# Patient Record
Sex: Female | Born: 2000 | Race: Black or African American | Hispanic: No | Marital: Single | Smoking: Never smoker
Health system: Southern US, Community
[De-identification: ages and names within clinical notes are randomized; demographics above are authoritative.]

## PROBLEM LIST (undated history)

## (undated) DIAGNOSIS — J45909 Unspecified asthma, uncomplicated: Secondary | ICD-10-CM

## (undated) DIAGNOSIS — F419 Anxiety disorder, unspecified: Secondary | ICD-10-CM

---
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
## 2020-11-26 ENCOUNTER — Emergency Department (HOSPITAL_COMMUNITY)
Admission: EM | Admit: 2020-11-26 | Discharge: 2020-11-26 | Disposition: A | Discharge status: Home / Self Care | Payer: Federal, State, Local not specified - PPO | Attending: Emergency Medicine | Admitting: Emergency Medicine

## 2020-11-26 ENCOUNTER — Encounter (HOSPITAL_COMMUNITY): Payer: Self-pay

## 2020-11-26 DIAGNOSIS — J020 Streptococcal pharyngitis: Secondary | ICD-10-CM | POA: Insufficient documentation

## 2020-11-26 DIAGNOSIS — J45909 Unspecified asthma, uncomplicated: Secondary | ICD-10-CM | POA: Insufficient documentation

## 2020-11-26 DIAGNOSIS — Z20822 Contact with and (suspected) exposure to covid-19: Secondary | ICD-10-CM | POA: Diagnosis not present

## 2020-11-26 DIAGNOSIS — J029 Acute pharyngitis, unspecified: Secondary | ICD-10-CM | POA: Diagnosis present

## 2020-11-26 LAB — RESP PANEL BY RT-PCR (FLU A&B, COVID) ARPGX2
Influenza A by PCR: NEGATIVE
Influenza B by PCR: NEGATIVE
SARS Coronavirus 2 by RT PCR: NEGATIVE

## 2020-11-26 LAB — GROUP A STREP BY PCR: Group A Strep by PCR: DETECTED — AB

## 2020-11-26 MED ORDER — DEXAMETHASONE 10 MG/ML FOR PEDIATRIC ORAL USE
10.0000 mg | Freq: Once | INTRAMUSCULAR | Status: AC
  Administered 2020-11-26: 10 mg via ORAL
  Filled 2020-11-26: qty 1

## 2020-11-26 MED ORDER — LIDOCAINE VISCOUS HCL 2 % MT SOLN
15.0000 mL | Freq: Once | OROMUCOSAL | Status: AC
  Administered 2020-11-26: 15 mL via OROMUCOSAL
  Filled 2020-11-26: qty 15

## 2020-11-26 MED ORDER — PENICILLIN G BENZATHINE 1200000 UNIT/2ML IM SUSY
1.2000 10*6.[IU] | PREFILLED_SYRINGE | Freq: Once | INTRAMUSCULAR | Status: AC
  Administered 2020-11-26: 1.2 10*6.[IU] via INTRAMUSCULAR
  Filled 2020-11-26: qty 2

## 2020-11-26 MED ORDER — DEXAMETHASONE 1 MG/ML PO CONC: 10.0000 mg | Freq: Once | ORAL | Status: DC

## 2020-11-26 MED ORDER — LIDOCAINE VISCOUS HCL 2 % MT SOLN: 15.0000 mL | OROMUCOSAL | 0 refills | Status: AC | PRN

## 2020-11-26 NOTE — ED Triage Notes (Signed)
Pt c/o sore throat. No known sick contacts.

## 2020-11-26 NOTE — ED Provider Notes (Signed)
Emergency Medicine Provider Triage Evaluation Note  Heidi Morris , a 20 y.o. female  was evaluated in triage.  Pt complains of sore throat x3 days.  Hurts to swallow.  Feels like she has some swelling in her throat.  No known COVID exposures.  No cough.  Has been able to tolerate her secretions.  No neck stiffness or neck rigidity.  No congestion, rhinorrhea  Review of Systems  Positive: Sore throat Negative: Cough, chest pain, congestion, rhinorrhea  Physical Exam  BP 114/73 (BP Location: Right Arm)   Pulse 82   Temp 99.3 F (37.4 C) (Oral)   Resp 16   SpO2 93%  Gen:   Awake, no distress   Mouth:  Posterior oropharyngeal erythema however no exudates.  Tonsils 0 bilaterally.  No evidence of PTA or RPA Neck:  Full range of motion without difficulty Resp:  Normal effort  MSK:   Moves extremities without difficulty  Other:    Medical Decision Making  Medically screening exam initiated at 1:15 PM.  Appropriate orders placed.  Heidi Morris was informed that the remainder of the evaluation will be completed by another provider, this initial triage assessment does not replace that evaluation, and the importance of remaining in the ED until their evaluation is complete.  Sore throat   Lorenda Grecco A, PA-C 11/26/20 1316    Ernie Avena, MD 11/27/20 [phone]

## 2020-11-26 NOTE — ED Provider Notes (Signed)
[facility]-EMERGENCY DEPT Provider Note   CSN: [id number] Arrival date & time: 11/26/20  1157     History Chief Complaint  Patient presents with   Sore Throat    Heidi Morris is a 20 y.o. female with past medical history significant for asthma who presents for vaginal sore throat x3 days.  Hurts to swallow.  Feels like she has some swelling in her throat.  No known COVID exposures.  No cough.  Has been able to tolerate her secretions.  Feels like "knives" when she swallows solid food.  No fever, chills, congestion, rhinorrhea, neck stiffness, neck rigidity, chest pain, shortness of breath.  Denies additional aggravating or alleviating factors.  Rates her pain a 7/10.  History obtained from patient and past medical records.  No interpreter used.  HPI     Past Medical History:  Diagnosis Date   Asthma     Patient Active Problem List   Diagnosis Date Noted   Asthma    Closed fracture of shaft of left femur, initial encounter ([facility]) 07/22/2019    Past Surgical History:  Procedure Laterality Date   FEMUR IM NAIL Left 07/23/2019   Procedure: INTRAMEDULLARY (IM) RETROGRADE FEMORAL NAILING;  Surgeon: Roby Lofts, MD;  Location: [facility];  Service: Orthopedics;  Laterality: Left;     OB History   No obstetric history on file.     Family History  Problem Relation Age of Onset   Pulmonary embolism Mother    Pulmonary embolism Maternal Grandmother     Social History   Tobacco Use   Smoking status: Never   Smokeless tobacco: Never    Home Medications Prior to Admission medications   Medication Sig Start Date End Date Taking? Authorizing Provider  lidocaine (XYLOCAINE) 2 % solution Use as directed 15 mLs in the mouth or throat as needed for up to 4 days for mouth pain. 11/26/20 11/30/20 Yes Landri Dorsainvil A, PA-C  albuterol (VENTOLIN HFA) 108 (90 Base) MCG/ACT inhaler Inhale 2 puffs into the lungs every 6 (six) hours as needed for wheezing or  shortness of breath.    [provider]  Cholecalciferol (VITAMIN D) 125 MCG (5000 UT) CAPS Take 5,000 Units by mouth daily. 07/25/19   Despina Hidden, PA-C  enoxaparin (LOVENOX) 40 MG/0.4ML injection Inject 0.4 mLs (40 mg total) into the skin daily. 07/25/19 08/24/19  Despina Hidden, PA-C  HYDROcodone-acetaminophen (NORCO) 7.5-325 MG tablet Take 1-2 tablets by mouth every 6 (six) hours as needed for severe pain. 07/25/19   Despina Hidden, PA-C  methocarbamol (ROBAXIN) 500 MG tablet Take 1 tablet (500 mg total) by mouth every 6 (six) hours as needed for muscle spasms. 07/25/19   Despina Hidden, PA-C  Burr Medico 150-35 MCG/24HR transdermal patch Place 1 patch onto the skin once a week. Wednesdays 07/14/19   [provider]    Allergies    Other  Review of Systems   Review of Systems  Constitutional: Negative.   HENT:  Positive for sore throat and trouble swallowing (Pain with swallowing). Negative for congestion, dental problem, ear discharge, ear pain, hearing loss, mouth sores, postnasal drip, rhinorrhea, sinus pressure, sinus pain, sneezing and voice change.   Respiratory: Negative.    Cardiovascular: Negative.   Gastrointestinal: Negative.   Genitourinary: Negative.   Musculoskeletal: Negative.   Neurological: Negative.   All other systems reviewed and are negative.  Physical Exam Updated Vital Signs BP 114/73 (BP Location: Right Arm)   Pulse  82   Temp 99.3 F (37.4 C) (Oral)   Resp 16   SpO2 93%   Physical Exam Vitals and nursing note reviewed.  Constitutional:      General: She is not in acute distress.    Appearance: She is well-developed. She is not ill-appearing, toxic-appearing or diaphoretic.  HENT:     Head: Normocephalic and atraumatic.     Jaw: There is normal jaw occlusion.     Right Ear: Tympanic membrane and ear canal normal.     Left Ear: Tympanic membrane and ear canal normal.     Nose: No congestion or rhinorrhea.     Mouth/Throat:     Lips:  Pink.     Mouth: Mucous membranes are moist.     Palate: No mass and lesions.     Pharynx: Uvula midline. Posterior oropharyngeal erythema and uvula swelling present.     Tonsils: No tonsillar abscesses. 0 on the right. 0 on the left.     Comments: History oropharyngeal erythema.  Tonsils 0 bilaterally.  No exudate.  Uvula midline.  Very minimal uvula swelling.  No evidence of PTA or RPA.  Sublingual area soft.  No pooling of secretions. Eyes:     Pupils: Pupils are equal, round, and reactive to light.  Neck:     Trachea: Trachea and phonation normal.     Comments: No neck stiffness or neck rigidity Cardiovascular:     Rate and Rhythm: Normal rate.     Pulses: Normal pulses.          Radial pulses are 2+ on the right side and 2+ on the left side.     Heart sounds: Normal heart sounds.  Pulmonary:     Effort: Pulmonary effort is normal. No respiratory distress.     Breath sounds: Normal breath sounds and air entry.  Chest:     Comments: Equal rise and fall to chest wall Abdominal:     General: There is no distension.  Musculoskeletal:        General: Normal range of motion.     Cervical back: Full passive range of motion without pain and normal range of motion.  Skin:    General: Skin is warm and dry.  Neurological:     General: No focal deficit present.     Mental Status: She is alert.  Psychiatric:        Mood and Affect: Mood normal.    ED Results / Procedures / Treatments   Labs (all labs ordered are listed, but only abnormal results are displayed) Labs Reviewed  GROUP A STREP BY PCR - Abnormal; Notable for the following components:      Result Value   Group A Strep by PCR DETECTED (*)    All other components within normal limits  RESP PANEL BY RT-PCR (FLU A&B, COVID) ARPGX2    EKG None  Radiology No results found.  Procedures Procedures   Medications Ordered in ED Medications  penicillin g benzathine (BICILLIN LA) 1200000 UNIT/2ML injection 1.2 Million  Units (1.2 Million Units Intramuscular Given 11/26/20 1531)  lidocaine (XYLOCAINE) 2 % viscous mouth solution 15 mL (15 mLs Mouth/Throat Given 11/26/20 1530)  dexamethasone (DECADRON) 10 MG/ML injection for Pediatric ORAL use 10 mg (10 mg Oral Given 11/26/20 1530)    ED Course  I have reviewed the triage vital signs and the nursing notes.  Pertinent labs & imaging results that were available during my care of the patient were reviewed by me  and considered in my medical decision making (see chart for details).  Here for evaluation of sore throat.  She is afebrile, nonseptic, not ill-appearing.  No recent COVID exposures.  Does have some posterior oropharyngeal erythema however no exudates.  Tonsils 0 bilaterally.  No evidence of PTA or RPA.  No pooling of secretions.  She has no neck stiffness neck rigidity.  She has no meningismus.  No phonation changes.  Heart and lungs clear.  Plan a COVID, strep reassess  COVID-negative  Strep positive.  She is tolerating p.o. intakes.  She elects for IM Bicillin.  We will treat symptomatically.  We will have her follow-up outpatient return for new or worsening symptoms.  The patient has been appropriately medically screened and/or stabilized in the ED. I have low suspicion for any other emergent medical condition which would require further screening, evaluation or treatment in the ED or require inpatient management.  Patient is hemodynamically stable and in no acute distress.  Patient able to ambulate in department prior to ED.  Evaluation does not show acute pathology that would require ongoing or additional emergent interventions while in the emergency department or further inpatient treatment.  I have discussed the diagnosis with the patient and answered all questions.  Pain is been managed while in the emergency department and patient has no further complaints prior to discharge.  Patient is comfortable with plan discussed in room and is stable for discharge  at this time.  I have discussed strict return precautions for returning to the emergency department.  Patient was encouraged to follow-up with PCP/specialist refer to at discharge.     MDM Rules/Calculators/A&P                           MADALINA ROSMAN was evaluated in Emergency Department on 11/26/2020 for the symptoms described in the history of present illness. She was evaluated in the context of the global COVID-19 pandemic, which necessitated consideration that the patient might be at risk for infection with the SARS-CoV-2 virus that causes COVID-19. Institutional protocols and algorithms that pertain to the evaluation of patients at risk for COVID-19 are in a state of rapid change based on information released by regulatory bodies including the CDC and federal and state organizations. These policies and algorithms were followed during the patient's care in the ED.  Final Clinical Impression(s) / ED Diagnoses Final diagnoses:  Strep pharyngitis    Rx / DC Orders ED Discharge Orders          Ordered    lidocaine (XYLOCAINE) 2 % solution  As needed        11/26/20 1539             Cloyce Blankenhorn A, PA-C 11/26/20 1540    Ernie Avena, MD 11/27/20 1444

## 2020-11-26 NOTE — ED Notes (Signed)
Patient temperature at discharge was 102.1. Instead of having the temperature addressed her, she wished to go home and take tylenol. PA made aware.

## 2020-11-26 NOTE — Discharge Instructions (Addendum)
You may take Tylenol and ibuprofen as needed at home for the pain.  Make sure to drink plenty of fluids.  I have written you for a liquid numbing medicine to help with the pain

## 2021-11-12 IMAGING — DX DG KNEE 1-2V PORT*L*
3 series · 3 of 3 positions shown · non-contrast
Comparison: Portable exam 3383 hours compared to 07/22/2019

CLINICAL DATA: Femoral fracture, in traction, post fall

EXAM:
PORTABLE LEFT KNEE - 1-2 VIEW

[knee ap]
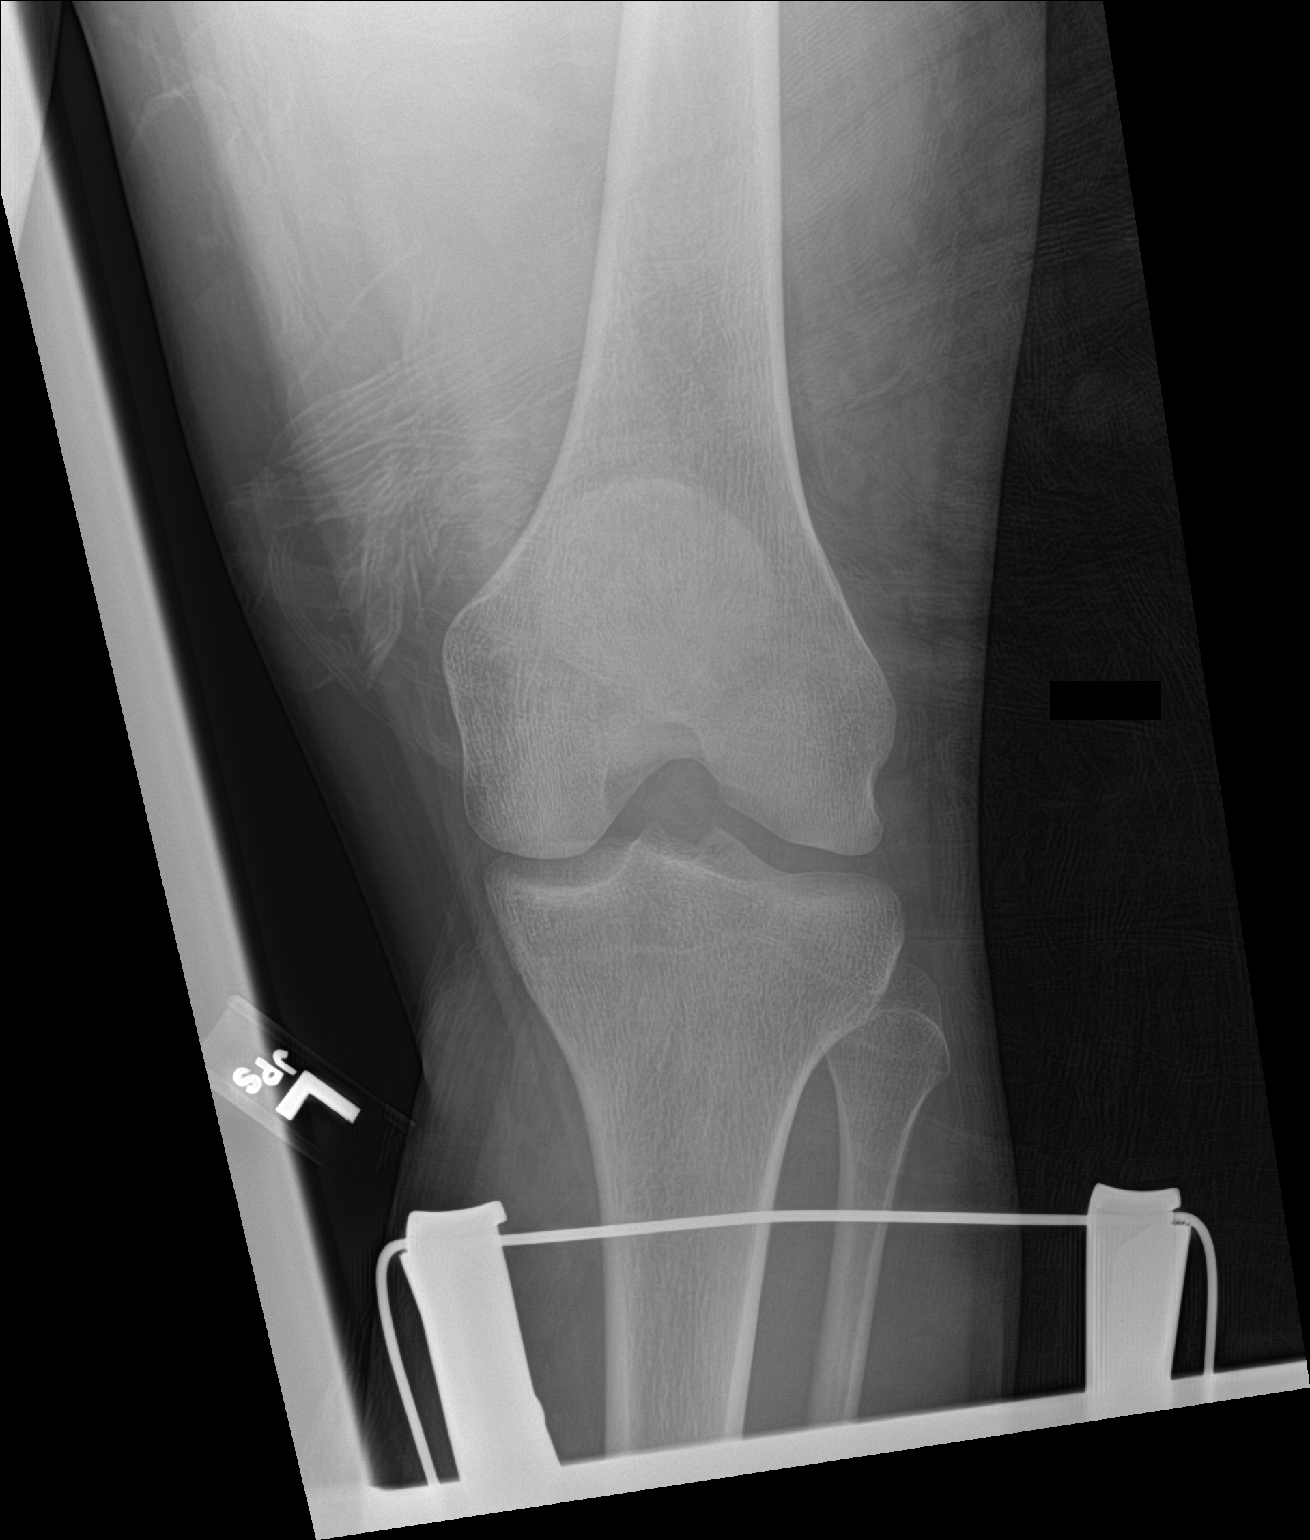

[knee lat (1 of 2)]
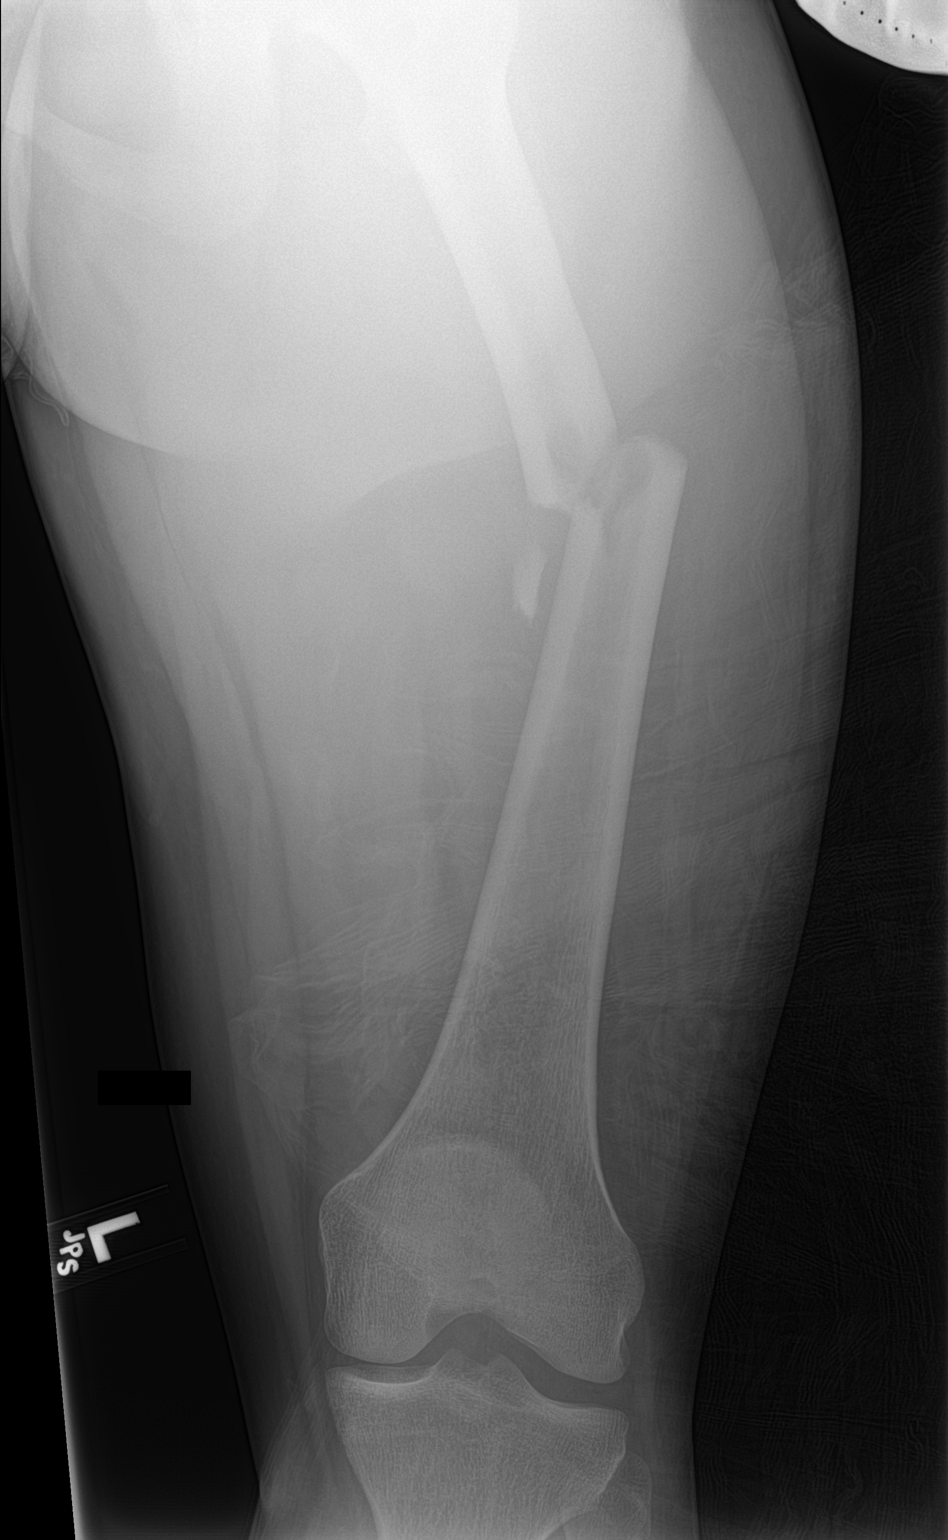

[knee lat (2 of 2)]
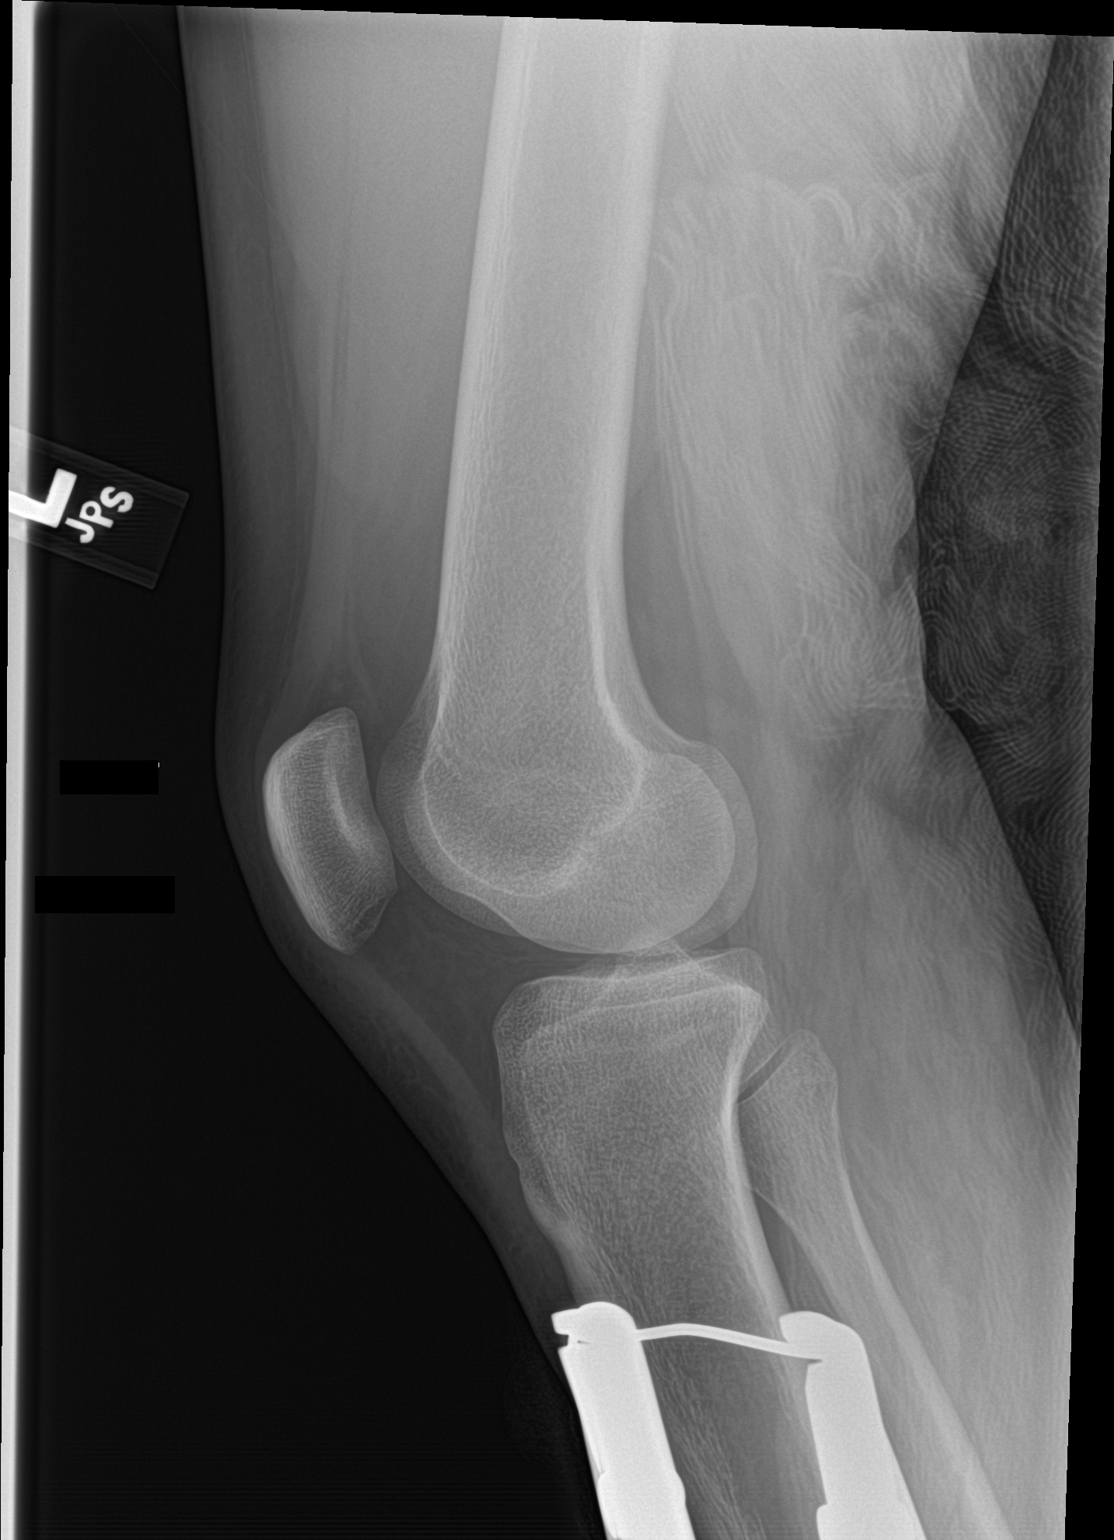

[3 of 3 positions shown; findings below may reference images not displayed]

FINDINGS: Osseous mineralization normal.

Joint spaces preserved.

Fracture mid LEFT femoral diaphysis with displacement and apex
lateral angulation again seen.

No additional fracture, dislocation, or bone destruction.
IMPRESSION: Angulated and displaced mid LEFT femoral diaphyseal fracture.

No additional LEFT knee abnormalities.

## 2021-11-12 IMAGING — RF DG FEMUR 2+V*L*
1 series · 9 of 9 positions shown · IV contrast (agent unspecified)
Comparison: Same day.

CLINICAL DATA: Intramedullary rod fixation.

EXAM:
DG C-ARM 1-60 MIN; LEFT FEMUR 2 VIEWS
CONTRAST:  None.
FLUOROSCOPY TIME:  Fluoroscopy Time:  2 minutes 17 seconds.
Number of Acquired Spot Images: 9.

[Series 1: run · 9 of 9 slices shown]
[im 1/9]
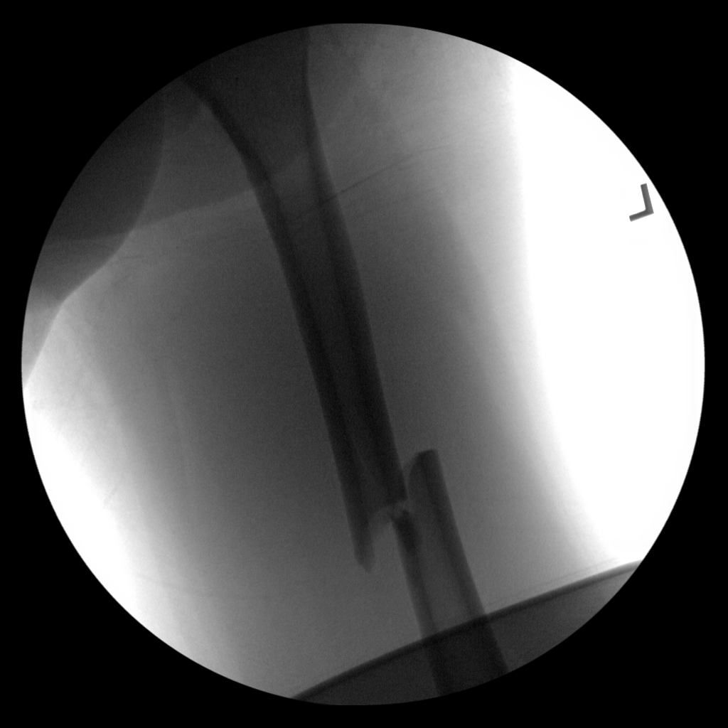
[im 2/9]
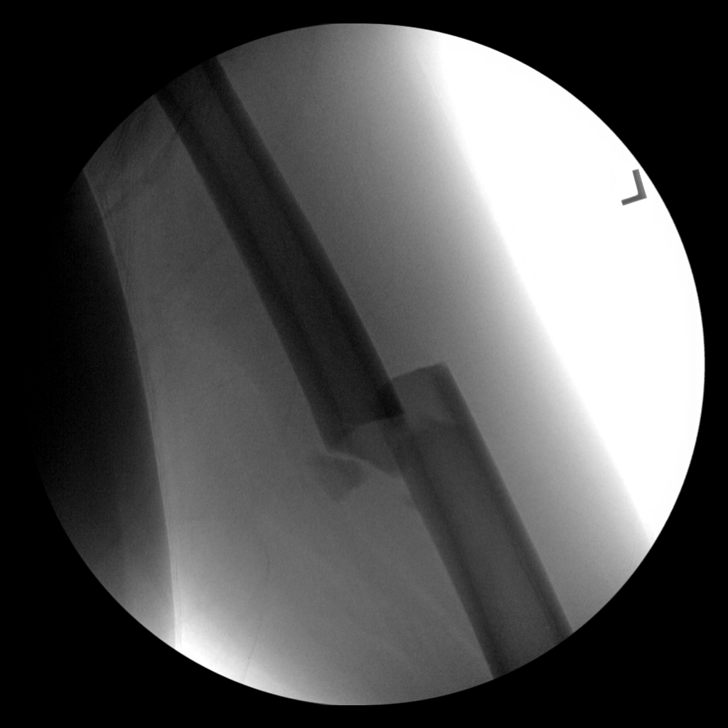
[im 3/9]
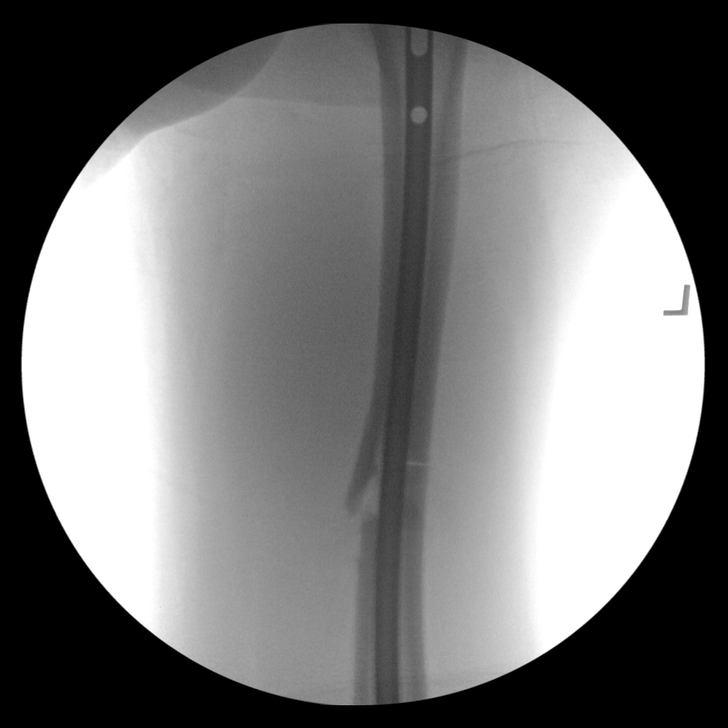
[im 4/9]
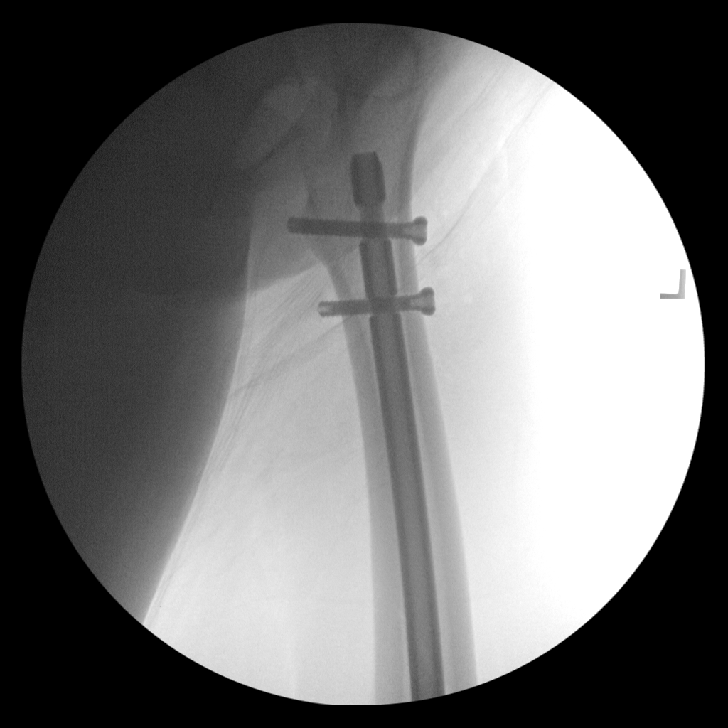
[im 5/9]
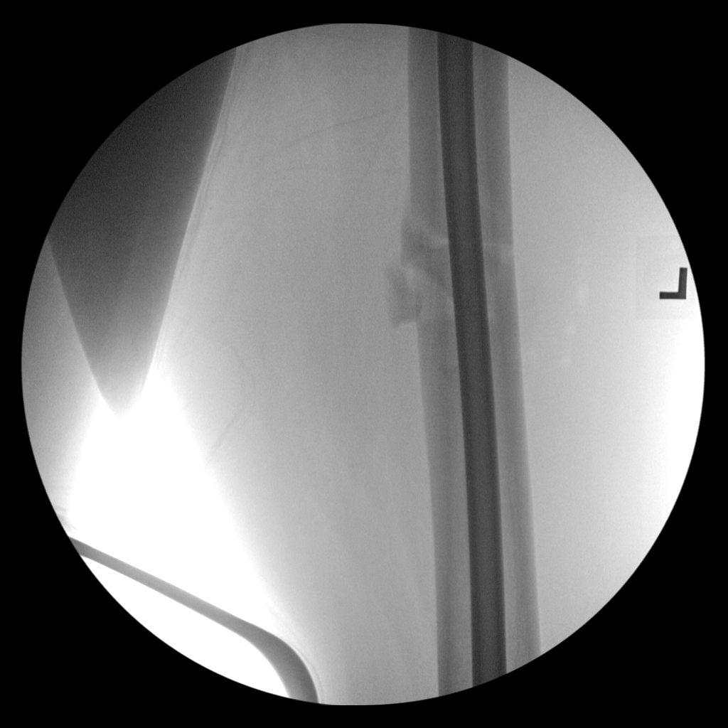
[im 6/9]
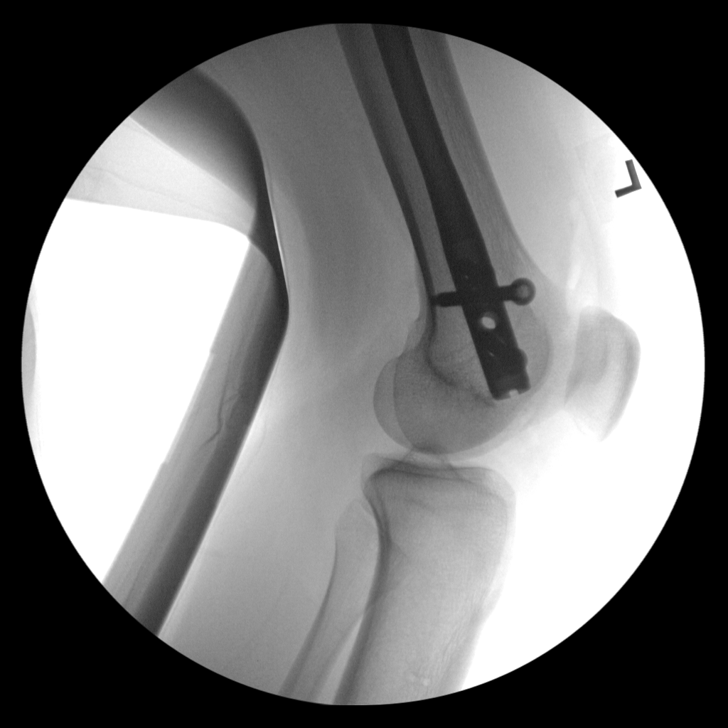
[im 7/9]
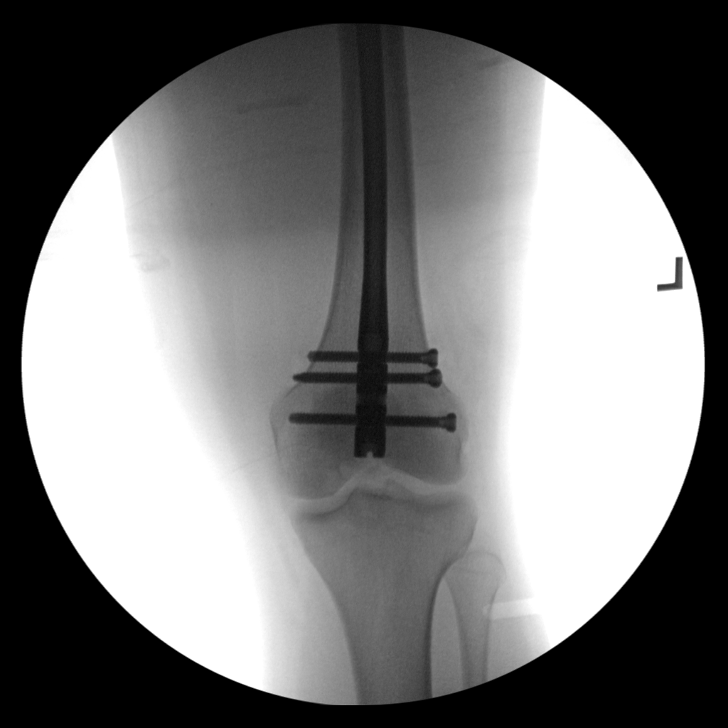
[im 8/9]
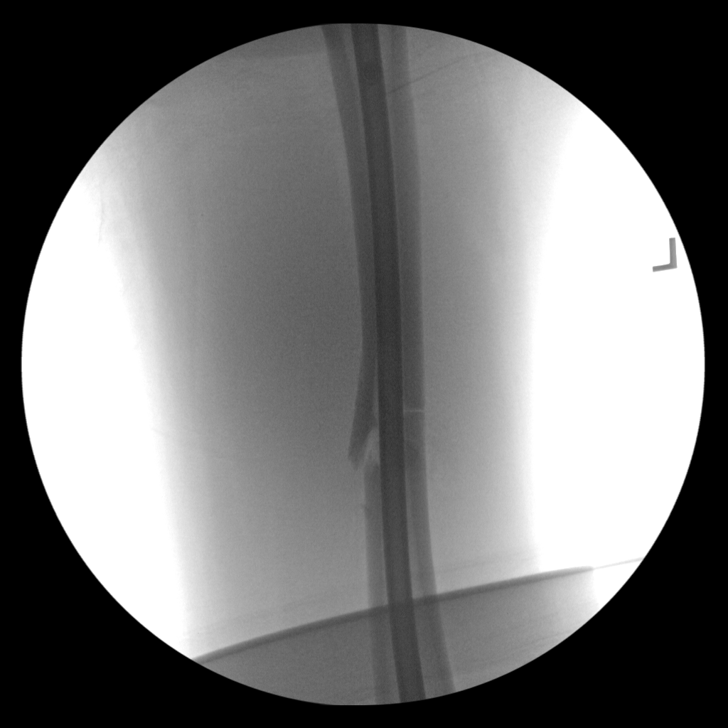
[im 9/9]
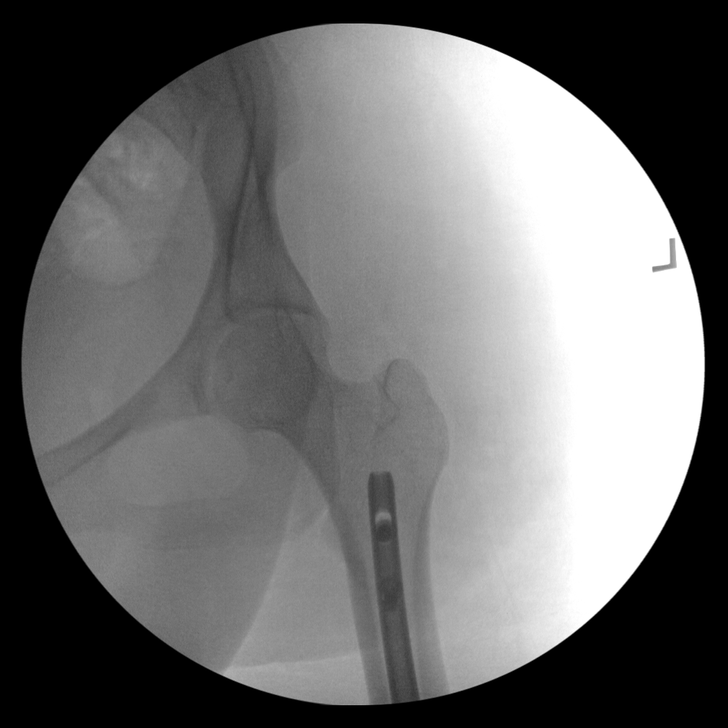

[9 of 9 positions shown; findings below may reference images not displayed]

FINDINGS: Nine intraoperative fluoroscopic images were obtained of the left
femur. These images demonstrate intramedullary rod fixation of left
femoral shaft fracture. Good alignment of fracture components is
noted.
IMPRESSION: Status post intramedullary rod fixation of left femoral shaft
fracture.

## 2022-08-04 ENCOUNTER — Emergency Department (HOSPITAL_COMMUNITY): Payer: Federal, State, Local not specified - PPO

## 2022-08-04 ENCOUNTER — Emergency Department (HOSPITAL_COMMUNITY)
Admission: EM | Admit: 2022-08-04 | Discharge: 2022-08-05 | Disposition: A | Discharge status: Home / Self Care | Payer: Federal, State, Local not specified - PPO | Attending: Emergency Medicine | Admitting: Emergency Medicine

## 2022-08-04 ENCOUNTER — Encounter (HOSPITAL_COMMUNITY): Payer: Self-pay

## 2022-08-04 DIAGNOSIS — R1031 Right lower quadrant pain: Secondary | ICD-10-CM | POA: Diagnosis not present

## 2022-08-04 DIAGNOSIS — J45909 Unspecified asthma, uncomplicated: Secondary | ICD-10-CM | POA: Insufficient documentation

## 2022-08-04 DIAGNOSIS — M542 Cervicalgia: Secondary | ICD-10-CM | POA: Diagnosis not present

## 2022-08-04 DIAGNOSIS — Y92410 Unspecified street and highway as the place of occurrence of the external cause: Secondary | ICD-10-CM | POA: Insufficient documentation

## 2022-08-04 DIAGNOSIS — S0990XA Unspecified injury of head, initial encounter: Secondary | ICD-10-CM | POA: Diagnosis present

## 2022-08-04 DIAGNOSIS — S0083XA Contusion of other part of head, initial encounter: Secondary | ICD-10-CM | POA: Insufficient documentation

## 2022-08-04 DIAGNOSIS — M25551 Pain in right hip: Secondary | ICD-10-CM | POA: Diagnosis not present

## 2022-08-04 DIAGNOSIS — M79604 Pain in right leg: Secondary | ICD-10-CM | POA: Diagnosis not present

## 2022-08-04 HISTORY — DX: Anxiety disorder, unspecified: F41.9

## 2022-08-04 LAB — PREGNANCY, URINE: Preg Test, Ur: NEGATIVE
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
## 2022-08-05 MED ORDER — IBUPROFEN 800 MG PO TABS
800.0000 mg | ORAL_TABLET | Freq: Once | ORAL | Status: AC
  Administered 2022-08-05: 800 mg via ORAL
  Filled 2022-08-05: qty 1
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
## 2022-08-05 NOTE — Discharge Instructions (Addendum)
Take NSAIDs or Tylenol as needed for the next week. Take this medicine with food. Watch for signs of concussion including light sensitivity, headache, brain fog.  Avoid screens for the next several days.  Take Tylenol or ibuprofen if needed.  Follow-up with your primary care doctor. Use a heating pad for sore muscles - use for 20 minutes several times a day Try gentle range of motion exercises Return for worsening symptoms including severe vomiting, vision changes, confusion, worsening abdominal pain, etc.
